# Patient Record
Sex: Female | Born: 1960 | Race: White | Hispanic: No | Marital: Married | Smoking: Never smoker
Health system: Southern US, Community
[De-identification: ages and names within clinical notes are randomized; demographics above are authoritative.]

## PROBLEM LIST (undated history)

## (undated) DIAGNOSIS — M8580 Other specified disorders of bone density and structure, unspecified site: Secondary | ICD-10-CM

## (undated) DIAGNOSIS — A90 Dengue fever [classical dengue]: Secondary | ICD-10-CM

## (undated) DIAGNOSIS — IMO0002 Reserved for concepts with insufficient information to code with codable children: Secondary | ICD-10-CM

## (undated) DIAGNOSIS — M329 Systemic lupus erythematosus, unspecified: Secondary | ICD-10-CM

## (undated) HISTORY — DX: Systemic lupus erythematosus, unspecified: M32.9

## (undated) HISTORY — DX: Other specified disorders of bone density and structure, unspecified site: M85.80

## (undated) HISTORY — DX: Dengue fever (classical dengue): A90

## (undated) HISTORY — PX: HERNIA REPAIR: SHX51

## (undated) HISTORY — DX: Reserved for concepts with insufficient information to code with codable children: IMO0002

---

## 2000-10-17 ENCOUNTER — Other Ambulatory Visit: Admission: RE | Admit: 2000-10-17 | Discharge: 2000-10-17 | Payer: Self-pay | Admitting: Obstetrics and Gynecology

## 2000-10-26 ENCOUNTER — Encounter: Payer: Self-pay | Admitting: Obstetrics and Gynecology

## 2000-10-26 ENCOUNTER — Encounter: Admission: RE | Admit: 2000-10-26 | Discharge: 2000-10-26 | Payer: Self-pay | Admitting: Obstetrics and Gynecology

## 2001-11-25 ENCOUNTER — Other Ambulatory Visit: Admission: RE | Admit: 2001-11-25 | Discharge: 2001-11-25 | Payer: Self-pay | Admitting: Obstetrics & Gynecology

## 2002-12-17 ENCOUNTER — Encounter: Payer: Self-pay | Admitting: Obstetrics & Gynecology

## 2002-12-17 ENCOUNTER — Ambulatory Visit (HOSPITAL_COMMUNITY): Admission: RE | Admit: 2002-12-17 | Discharge: 2002-12-17 | Payer: Self-pay | Admitting: Obstetrics & Gynecology

## 2003-04-10 ENCOUNTER — Encounter: Admission: RE | Admit: 2003-04-10 | Discharge: 2003-04-10 | Payer: Self-pay | Admitting: Obstetrics & Gynecology

## 2004-01-04 ENCOUNTER — Ambulatory Visit (HOSPITAL_COMMUNITY): Admission: RE | Admit: 2004-01-04 | Discharge: 2004-01-04 | Payer: Self-pay | Admitting: Obstetrics & Gynecology

## 2005-03-07 ENCOUNTER — Ambulatory Visit (HOSPITAL_COMMUNITY): Admission: RE | Admit: 2005-03-07 | Discharge: 2005-03-07 | Payer: Self-pay | Admitting: Obstetrics & Gynecology

## 2006-02-12 ENCOUNTER — Ambulatory Visit (HOSPITAL_COMMUNITY): Admission: RE | Admit: 2006-02-12 | Discharge: 2006-02-12 | Payer: Self-pay | Admitting: Obstetrics & Gynecology

## 2006-03-08 ENCOUNTER — Ambulatory Visit (HOSPITAL_COMMUNITY): Admission: RE | Admit: 2006-03-08 | Discharge: 2006-03-08 | Payer: Self-pay | Admitting: Obstetrics & Gynecology

## 2007-03-11 ENCOUNTER — Ambulatory Visit (HOSPITAL_COMMUNITY): Admission: RE | Admit: 2007-03-11 | Discharge: 2007-03-11 | Payer: Self-pay | Admitting: Obstetrics & Gynecology

## 2008-03-17 ENCOUNTER — Ambulatory Visit (HOSPITAL_COMMUNITY): Admission: RE | Admit: 2008-03-17 | Discharge: 2008-03-17 | Payer: Self-pay | Admitting: Obstetrics & Gynecology

## 2009-03-18 ENCOUNTER — Ambulatory Visit (HOSPITAL_COMMUNITY): Admission: RE | Admit: 2009-03-18 | Discharge: 2009-03-18 | Payer: Self-pay | Admitting: Obstetrics & Gynecology

## 2010-03-18 ENCOUNTER — Ambulatory Visit (HOSPITAL_COMMUNITY)
Admission: RE | Admit: 2010-03-18 | Discharge: 2010-03-18 | Payer: Self-pay | Source: Home / Self Care | Attending: Obstetrics & Gynecology | Admitting: Obstetrics & Gynecology

## 2010-10-12 ENCOUNTER — Other Ambulatory Visit: Payer: Self-pay | Admitting: Rheumatology

## 2010-10-12 ENCOUNTER — Ambulatory Visit
Admission: RE | Admit: 2010-10-12 | Discharge: 2010-10-12 | Disposition: A | Discharge status: Home / Self Care | Payer: 59 | Source: Ambulatory Visit | Attending: Rheumatology | Admitting: Rheumatology

## 2010-10-12 DIAGNOSIS — M542 Cervicalgia: Secondary | ICD-10-CM

## 2011-01-02 ENCOUNTER — Other Ambulatory Visit: Payer: Self-pay | Admitting: Obstetrics & Gynecology

## 2011-01-02 DIAGNOSIS — Z1231 Encounter for screening mammogram for malignant neoplasm of breast: Secondary | ICD-10-CM

## 2011-03-20 ENCOUNTER — Ambulatory Visit (HOSPITAL_COMMUNITY)
Admission: RE | Admit: 2011-03-20 | Discharge: 2011-03-20 | Disposition: A | Discharge status: Home / Self Care | Payer: 59 | Source: Ambulatory Visit | Attending: Obstetrics & Gynecology | Admitting: Obstetrics & Gynecology

## 2011-03-20 DIAGNOSIS — Z1231 Encounter for screening mammogram for malignant neoplasm of breast: Secondary | ICD-10-CM | POA: Insufficient documentation

## 2012-01-01 ENCOUNTER — Other Ambulatory Visit: Payer: Self-pay | Admitting: Obstetrics & Gynecology

## 2012-01-01 DIAGNOSIS — Z1231 Encounter for screening mammogram for malignant neoplasm of breast: Secondary | ICD-10-CM

## 2012-03-21 ENCOUNTER — Ambulatory Visit (HOSPITAL_COMMUNITY)
Admission: RE | Admit: 2012-03-21 | Discharge: 2012-03-21 | Disposition: A | Discharge status: Home / Self Care | Payer: 59 | Source: Ambulatory Visit | Attending: Obstetrics & Gynecology | Admitting: Obstetrics & Gynecology

## 2012-03-21 ENCOUNTER — Ambulatory Visit (HOSPITAL_COMMUNITY): Payer: 59

## 2012-03-21 DIAGNOSIS — Z1231 Encounter for screening mammogram for malignant neoplasm of breast: Secondary | ICD-10-CM | POA: Insufficient documentation

## 2012-07-18 DIAGNOSIS — A90 Dengue fever [classical dengue]: Secondary | ICD-10-CM

## 2012-07-18 HISTORY — DX: Dengue fever (classical dengue): A90

## 2013-01-09 ENCOUNTER — Encounter: Payer: Self-pay | Admitting: Obstetrics & Gynecology

## 2013-02-17 ENCOUNTER — Other Ambulatory Visit: Payer: Self-pay | Admitting: Obstetrics & Gynecology

## 2013-02-17 DIAGNOSIS — Z1231 Encounter for screening mammogram for malignant neoplasm of breast: Secondary | ICD-10-CM

## 2013-03-27 ENCOUNTER — Ambulatory Visit (HOSPITAL_COMMUNITY)
Admission: RE | Admit: 2013-03-27 | Discharge: 2013-03-27 | Disposition: A | Discharge status: Home / Self Care | Payer: 59 | Source: Ambulatory Visit | Attending: Obstetrics & Gynecology | Admitting: Obstetrics & Gynecology

## 2013-03-27 ENCOUNTER — Ambulatory Visit (INDEPENDENT_AMBULATORY_CARE_PROVIDER_SITE_OTHER): Payer: 59 | Admitting: Obstetrics & Gynecology

## 2013-03-27 ENCOUNTER — Encounter: Payer: Self-pay | Admitting: Obstetrics & Gynecology

## 2013-03-27 VITALS — BP 142/86 | HR 64 | Temp 97.8°F | Ht 62.0 in | Wt 111.0 lb

## 2013-03-27 DIAGNOSIS — Z1231 Encounter for screening mammogram for malignant neoplasm of breast: Secondary | ICD-10-CM | POA: Insufficient documentation

## 2013-03-27 DIAGNOSIS — Z01419 Encounter for gynecological examination (general) (routine) without abnormal findings: Secondary | ICD-10-CM
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
## 2013-03-28 NOTE — Patient Instructions (Signed)

## 2013-03-31 LAB — PAP IG AND HPV HIGH-RISK: HPV DNA High Risk: NOT DETECTED

## 2013-12-23 ENCOUNTER — Other Ambulatory Visit: Payer: Self-pay | Admitting: Obstetrics & Gynecology

## 2014-01-22 ENCOUNTER — Other Ambulatory Visit: Payer: Self-pay | Admitting: *Deleted

## 2014-01-22 DIAGNOSIS — N951 Menopausal and female climacteric states: Secondary | ICD-10-CM

## 2014-01-22 MED ORDER — CONJ ESTROG-MEDROXYPROGEST ACE 0.625-2.5 MG PO TABS: ORAL_TABLET | ORAL | Status: DC

## 2014-01-23 ENCOUNTER — Other Ambulatory Visit: Payer: Self-pay

## 2014-01-23 DIAGNOSIS — Z1231 Encounter for screening mammogram for malignant neoplasm of breast: Secondary | ICD-10-CM

## 2014-02-25 ENCOUNTER — Other Ambulatory Visit: Payer: Self-pay | Admitting: Obstetrics & Gynecology

## 2014-02-25 DIAGNOSIS — Z1231 Encounter for screening mammogram for malignant neoplasm of breast: Secondary | ICD-10-CM

## 2014-03-16 ENCOUNTER — Encounter: Payer: Self-pay | Admitting: *Deleted

## 2014-03-17 ENCOUNTER — Encounter: Payer: Self-pay | Admitting: Obstetrics & Gynecology

## 2014-03-30 ENCOUNTER — Ambulatory Visit: Payer: 59 | Admitting: Obstetrics & Gynecology

## 2014-04-02 ENCOUNTER — Ambulatory Visit (HOSPITAL_COMMUNITY)
Admission: RE | Admit: 2014-04-02 | Discharge: 2014-04-02 | Disposition: A | Discharge status: Home / Self Care | Payer: 59 | Source: Ambulatory Visit | Attending: Obstetrics & Gynecology | Admitting: Obstetrics & Gynecology

## 2014-04-02 DIAGNOSIS — Z1231 Encounter for screening mammogram for malignant neoplasm of breast: Secondary | ICD-10-CM

## 2014-04-06 ENCOUNTER — Other Ambulatory Visit: Payer: Self-pay | Admitting: Obstetrics & Gynecology

## 2014-04-06 DIAGNOSIS — N6452 Nipple discharge: Secondary | ICD-10-CM

## 2014-04-21 ENCOUNTER — Ambulatory Visit
Admission: RE | Admit: 2014-04-21 | Discharge: 2014-04-21 | Disposition: A | Discharge status: Home / Self Care | Payer: 59 | Source: Ambulatory Visit | Attending: Obstetrics & Gynecology | Admitting: Obstetrics & Gynecology

## 2014-04-21 DIAGNOSIS — N6452 Nipple discharge: Secondary | ICD-10-CM

## 2014-04-27 ENCOUNTER — Telehealth: Payer: Self-pay | Admitting: Obstetrics

## 2014-04-27 NOTE — Telephone Encounter (Signed)
04/27/2014 - Patient returned call and scheduled appt. brm

## 2014-04-30 ENCOUNTER — Ambulatory Visit (INDEPENDENT_AMBULATORY_CARE_PROVIDER_SITE_OTHER): Payer: 59 | Admitting: Certified Nurse Midwife

## 2014-04-30 ENCOUNTER — Encounter: Payer: Self-pay | Admitting: Certified Nurse Midwife

## 2014-04-30 VITALS — BP 130/79 | HR 59 | Temp 97.9°F | Ht 62.0 in | Wt 108.0 lb

## 2014-04-30 DIAGNOSIS — R6889 Other general symptoms and signs: Secondary | ICD-10-CM

## 2014-04-30 DIAGNOSIS — Z01419 Encounter for gynecological examination (general) (routine) without abnormal findings: Secondary | ICD-10-CM

## 2014-04-30 DIAGNOSIS — Z124 Encounter for screening for malignant neoplasm of cervix: Secondary | ICD-10-CM

## 2014-04-30 DIAGNOSIS — Z119 Encounter for screening for infectious and parasitic diseases, unspecified: Secondary | ICD-10-CM

## 2014-04-30 DIAGNOSIS — Z113 Encounter for screening for infections with a predominantly sexual mode of transmission: Secondary | ICD-10-CM

## 2014-04-30 DIAGNOSIS — Z0001 Encounter for general adult medical examination with abnormal findings: Secondary | ICD-10-CM
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
## 2014-05-05 LAB — SURESWAB, VAGINOSIS/VAGINITIS PLUS
Atopobium vaginae: NOT DETECTED Log (cells/mL)
C. ALBICANS, DNA: NOT DETECTED
C. PARAPSILOSIS, DNA: NOT DETECTED
C. TRACHOMATIS RNA, TMA: NOT DETECTED
C. TROPICALIS, DNA: NOT DETECTED
C. glabrata, DNA: NOT DETECTED
GARDNERELLA VAGINALIS: NOT DETECTED Log (cells/mL)
LACTOBACILLUS SPECIES: >8 Log (cells/mL)
MEGASPHAERA SPECIES: NOT DETECTED Log (cells/mL)
N. gonorrhoeae RNA, TMA: NOT DETECTED
T. VAGINALIS RNA, QL TMA: NOT DETECTED

## 2014-05-05 LAB — PAP IG AND HPV HIGH-RISK: HPV DNA HIGH RISK: NOT DETECTED

## 2014-10-12 ENCOUNTER — Other Ambulatory Visit: Payer: Self-pay | Admitting: Obstetrics & Gynecology

## 2014-10-12 DIAGNOSIS — N6452 Nipple discharge: Secondary | ICD-10-CM

## 2014-10-20 ENCOUNTER — Other Ambulatory Visit: Payer: Self-pay | Admitting: Obstetrics & Gynecology

## 2014-10-20 ENCOUNTER — Other Ambulatory Visit: Payer: Self-pay

## 2014-10-20 DIAGNOSIS — N6452 Nipple discharge: Secondary | ICD-10-CM

## 2014-10-21 ENCOUNTER — Other Ambulatory Visit: Payer: Self-pay | Admitting: Obstetrics

## 2014-10-21 ENCOUNTER — Other Ambulatory Visit: Payer: 59

## 2014-10-21 DIAGNOSIS — N6452 Nipple discharge: Secondary | ICD-10-CM

## 2014-10-27 ENCOUNTER — Ambulatory Visit
Admission: RE | Admit: 2014-10-27 | Discharge: 2014-10-27 | Disposition: A | Discharge status: Home / Self Care | Payer: 59 | Source: Ambulatory Visit | Attending: Obstetrics | Admitting: Obstetrics

## 2014-10-27 DIAGNOSIS — N6452 Nipple discharge: Secondary | ICD-10-CM

## 2015-01-28 ENCOUNTER — Telehealth: Payer: Self-pay | Admitting: *Deleted

## 2015-01-28 NOTE — Telephone Encounter (Signed)
Patient reports she was seen for her annual exam last year and was not please with her visit or the way her insurance was billed. She is a former patient of Dr Delsa Sale and she saw [facility]. 11:19 Call to patient- she did come when [place] first started and we discussed that- she said she did not get refills on her HRT and when she needed additional views on her MM she had to be rescheduled because they had not received authorization. ( Do not see that request- and told her that- I apologized for that)  She also states she was seen for a wellness visit and got charges she normally never had on her previous visits. Told her I was not sure how that happened- she should let the provider know she is using wellness coverage at her appointment- so that the appropriate charges can be applied. (will have [facility] [place] check) got number for her [facility] and will call her refills in- she is due her annual exam in February- but not sure if she will be returning. [facility] [phone] opt 3

## 2015-04-20 ENCOUNTER — Other Ambulatory Visit: Payer: Self-pay | Admitting: Obstetrics

## 2015-04-20 ENCOUNTER — Other Ambulatory Visit: Payer: Self-pay | Admitting: Certified Nurse Midwife

## 2015-04-20 DIAGNOSIS — N6452 Nipple discharge: Secondary | ICD-10-CM

## 2015-04-27 ENCOUNTER — Ambulatory Visit
Admission: RE | Admit: 2015-04-27 | Discharge: 2015-04-27 | Disposition: A | Discharge status: Home / Self Care | Payer: 59 | Source: Ambulatory Visit | Attending: Obstetrics | Admitting: Obstetrics

## 2015-04-27 DIAGNOSIS — N6452 Nipple discharge: Secondary | ICD-10-CM

## 2015-12-30 ENCOUNTER — Other Ambulatory Visit: Payer: Self-pay | Admitting: Certified Nurse Midwife

## 2015-12-30 DIAGNOSIS — N951 Menopausal and female climacteric states: Secondary | ICD-10-CM

## 2016-05-23 ENCOUNTER — Other Ambulatory Visit: Payer: Self-pay | Admitting: Family Medicine

## 2016-05-23 DIAGNOSIS — D242 Benign neoplasm of left breast: Secondary | ICD-10-CM

## 2016-06-12 ENCOUNTER — Other Ambulatory Visit: Payer: 59

## 2016-06-13 ENCOUNTER — Ambulatory Visit
Admission: RE | Admit: 2016-06-13 | Discharge: 2016-06-13 | Disposition: A | Discharge status: Home / Self Care | Payer: 59 | Source: Ambulatory Visit | Attending: Family Medicine | Admitting: Family Medicine

## 2016-06-13 ENCOUNTER — Other Ambulatory Visit: Payer: Self-pay | Admitting: Family Medicine

## 2016-06-13 DIAGNOSIS — R928 Other abnormal and inconclusive findings on diagnostic imaging of breast: Secondary | ICD-10-CM

## 2016-06-13 DIAGNOSIS — D242 Benign neoplasm of left breast: Secondary | ICD-10-CM

## 2016-06-13 DIAGNOSIS — N6320 Unspecified lump in the left breast, unspecified quadrant: Secondary | ICD-10-CM

## 2016-06-14 ENCOUNTER — Telehealth: Payer: Self-pay | Admitting: *Deleted

## 2016-06-14 NOTE — Telephone Encounter (Signed)
Patient is calling to let Dr Jodi Mourning know that she has been diagnosed with early stage breast cancer and she has to stop her HRT. She need to know how to do that. Per Dr Jodi Mourning- she will need to stop- she may want to step down her dose as to every other day for a couple week then weekly then stop to try to minimize her symptoms or she can stop- she will have symptoms and she may want to expect them.

## 2018-04-24 DIAGNOSIS — M329 Systemic lupus erythematosus, unspecified: Secondary | ICD-10-CM | POA: Diagnosis not present

## 2018-04-24 DIAGNOSIS — M8580 Other specified disorders of bone density and structure, unspecified site: Secondary | ICD-10-CM | POA: Diagnosis not present

## 2018-08-07 DIAGNOSIS — M8589 Other specified disorders of bone density and structure, multiple sites: Secondary | ICD-10-CM | POA: Diagnosis not present

## 2018-08-09 DIAGNOSIS — Z9889 Other specified postprocedural states: Secondary | ICD-10-CM | POA: Diagnosis not present

## 2018-08-09 DIAGNOSIS — Z9012 Acquired absence of left breast and nipple: Secondary | ICD-10-CM | POA: Diagnosis not present

## 2018-08-09 DIAGNOSIS — Z923 Personal history of irradiation: Secondary | ICD-10-CM | POA: Diagnosis not present

## 2018-08-09 DIAGNOSIS — Z170 Estrogen receptor positive status [ER+]: Secondary | ICD-10-CM | POA: Diagnosis not present

## 2018-08-09 DIAGNOSIS — D0512 Intraductal carcinoma in situ of left breast: Secondary | ICD-10-CM | POA: Diagnosis not present

## 2018-11-17 IMAGING — MG 2D DIGITAL DIAGNOSTIC BILATERAL MAMMOGRAM WITH CAD AND ADJUNCT T
8 of 12 series · 8 of 24 positions shown · non-contrast
Comparison: Previous exam(s).

CLINICAL DATA: Followup probable left breast fibroadenomas.

EXAM:
2D DIGITAL DIAGNOSTIC BILATERAL MAMMOGRAM WITH CAD AND ADJUNCT TOMO
ULTRASOUND LEFT BREAST

[L CC (1 of 2)]
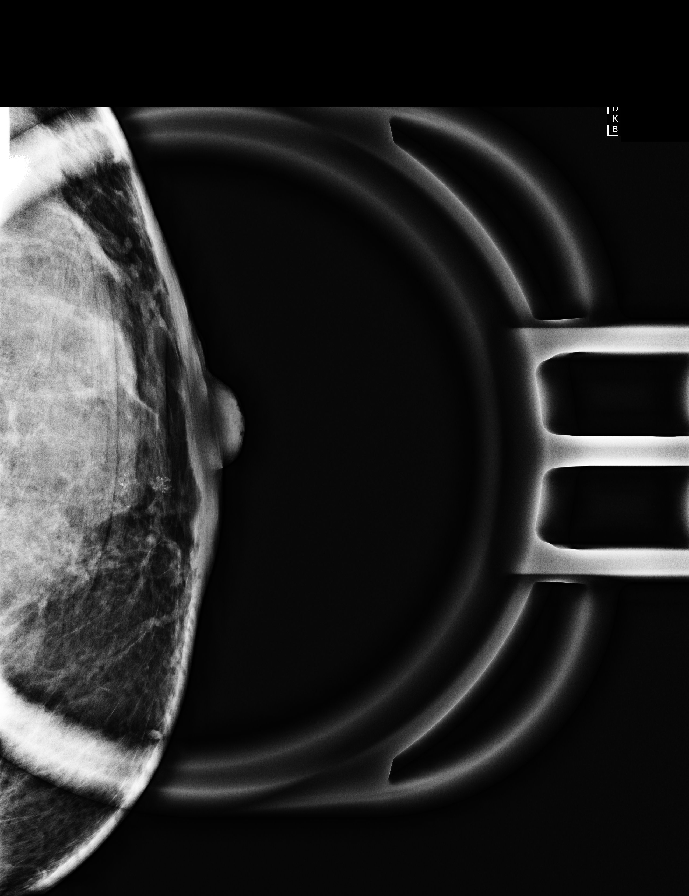

[L ML]
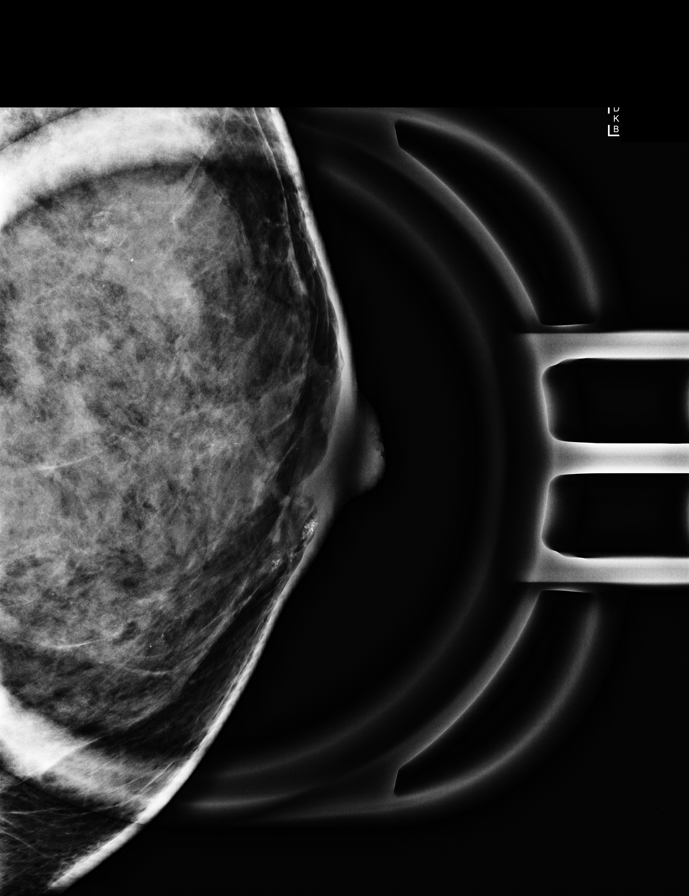

[R CC synth-2D]
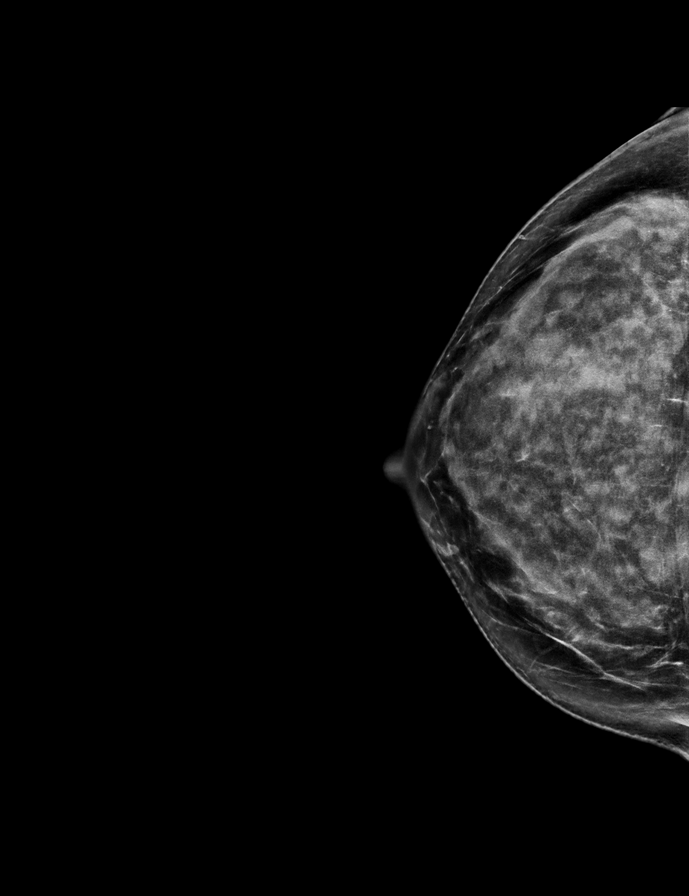

[L CC synth-2D]
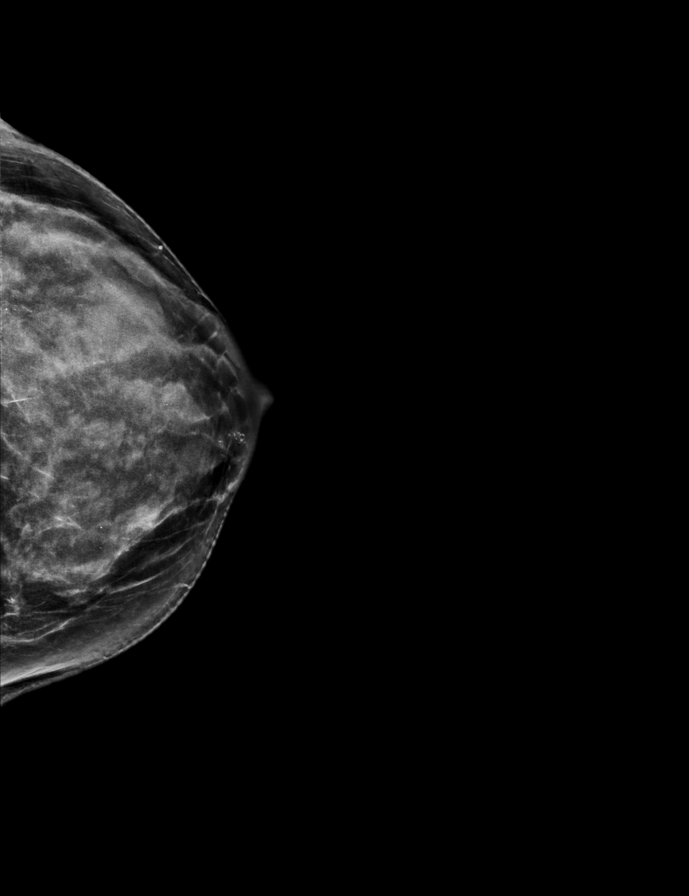

[L MLO synth-2D]
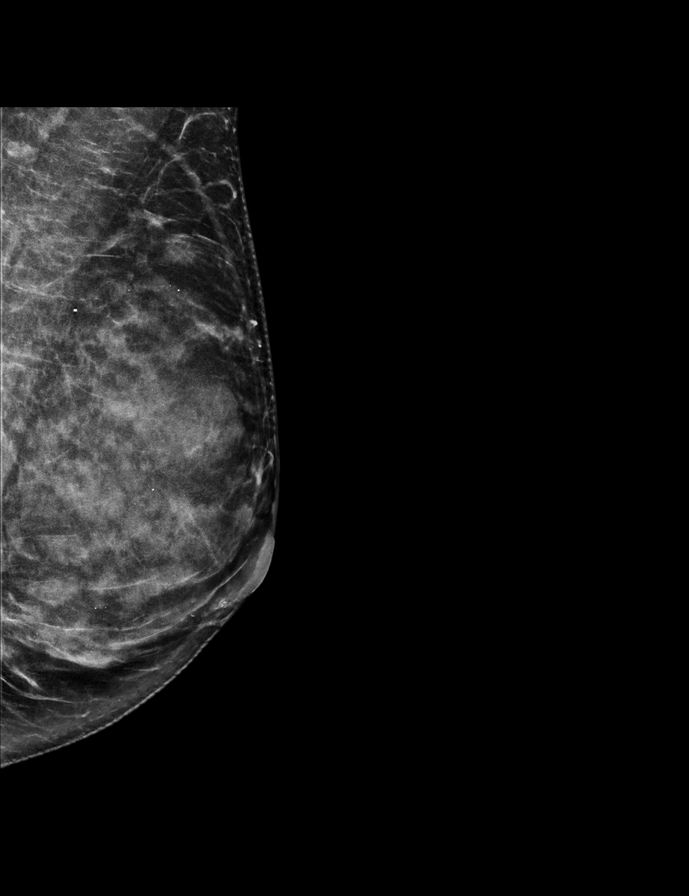

[R MLO synth-2D]
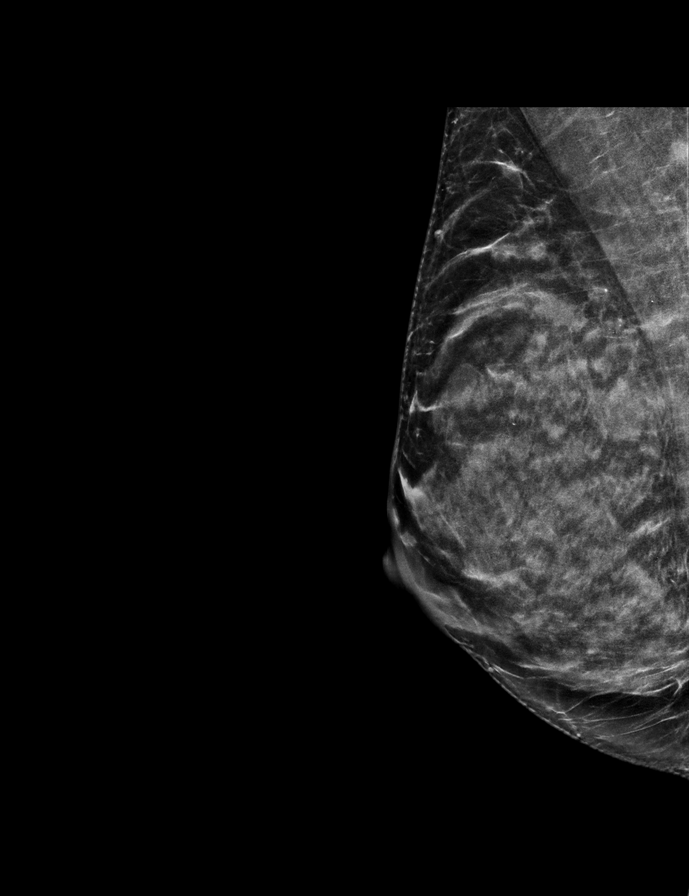

[L CC (2 of 2)]
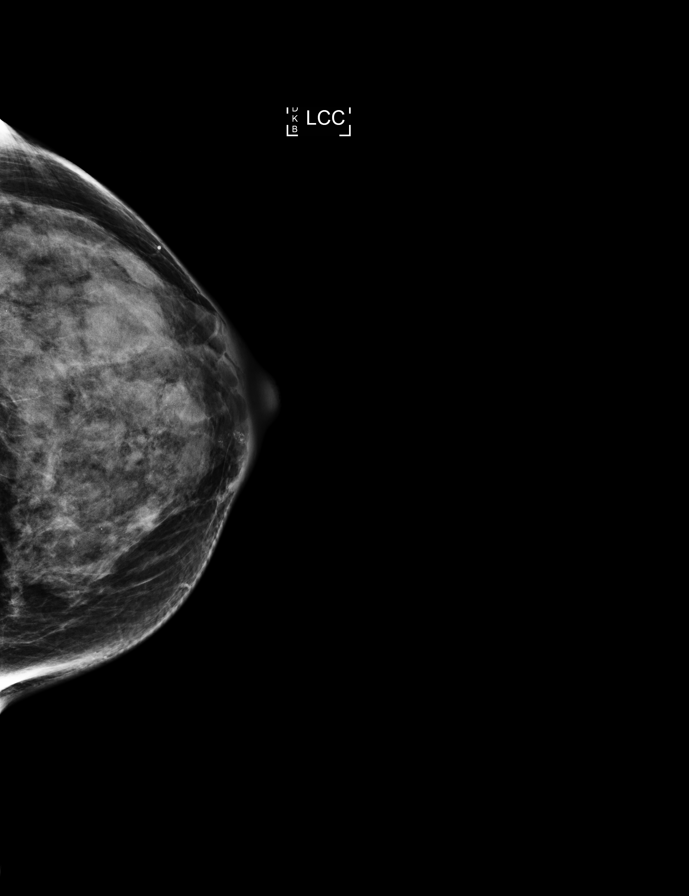

[L MLO]
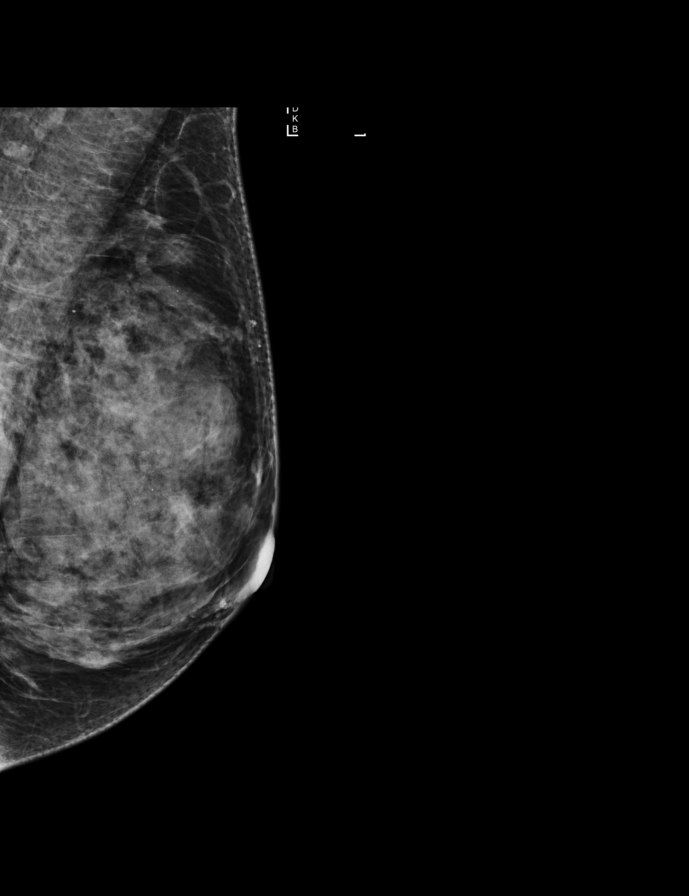

[8 of 24 positions shown; findings below may reference images not displayed]

ACR Breast Density Category d: The breast tissue is extremely dense,
which lowers the sensitivity of mammography.
FINDINGS: There is an 8 x 7 x 4 mm group of a large number of developing
microcalcifications in the 6:30 o'clock retroareolar region of the
left breast, superficially. There has been no significant change in
an oval, circumscribed and partially obscured mass in the lateral
left breast. No findings elsewhere in either breast suspicious for
malignancy.

Mammographic images were processed with CAD.

On physical exam, there is an approximately 2.5 x 1.5 cm oval,
circumscribed, palpable mass in the 1 o'clock position of the left
breast, 3 cm from the nipple. There is a small vertically-oriented
area of linear soft tissue thickening in the 6:30 o'clock
retroareolar left breast.

Targeted ultrasound is performed, showing a 2.2 x 2.1 x 1.0 cm oval,
horizontally oriented, circumscribed, hypoechoic mass in the 1
o'clock position of the left breast, 3 cm from the nipple. This
measured 2.1 x 2.1 x 1.1 cm on 04/27/2015, 2.3 x 1.9 x 1.2 cm on
10/27/2014 and 2.4 x 1.9 x 1.2 cm on 04/21/2014.

In the 12 o'clock position of the left breast, 2 cm from the nipple,
a 0.7 x 0.6 x 0.6 cm rounded, hypoechoic mass is demonstrated. This
measured 0.7 x 0.6 x 0.6 cm on 04/27/2015, 0.8 x 0.8 x 0.5 cm on
10/27/2014 and 0.8 x 0.7 x 0.5 cm on 04/21/2014.

Multiple dilated retroareolar ducts are demonstrated. In the 6:30
o'clock retroareolar region, some of these ducts contain debris and
microcalcifications.
IMPRESSION: 1. 8 x 7 x 4 mm developing group of microcalcifications in the 6:30
o'clock retroareolar left breast. These are within dilated ducts
containing debris.
2. Stable fibroadenomas in the 1 o'clock and 12 o'clock positions of
the left breast. The 2 year stability is compatible with a benign
process and these do not need further follow-up.

RECOMMENDATION:
Ultrasound-guided core needle biopsy of dilated ducts containing
debris and microcalcifications in the 6:30 o'clock retroareolar left
breast, to exclude the possibility of ductal carcinoma in situ. This
has been discussed with the patient and scheduled to follow.

I have discussed the findings and recommendations with the patient.
Results were also provided in writing at the conclusion of the
visit. If applicable, a reminder letter will be sent to the patient
regarding the next appointment.

BI-RADS CATEGORY  4: Suspicious.

## 2019-01-28 DIAGNOSIS — Z0000 Encounter for general adult medical examination without abnormal findings: Secondary | ICD-10-CM | POA: Diagnosis not present

## 2019-01-28 DIAGNOSIS — Z01419 Encounter for gynecological examination (general) (routine) without abnormal findings: Secondary | ICD-10-CM | POA: Diagnosis not present

## 2019-01-28 DIAGNOSIS — Z13220 Encounter for screening for lipoid disorders: Secondary | ICD-10-CM | POA: Diagnosis not present

## 2019-01-28 DIAGNOSIS — Z6821 Body mass index (BMI) 21.0-21.9, adult: Secondary | ICD-10-CM | POA: Diagnosis not present

## 2019-05-12 DIAGNOSIS — M8580 Other specified disorders of bone density and structure, unspecified site: Secondary | ICD-10-CM | POA: Diagnosis not present

## 2019-05-12 DIAGNOSIS — M329 Systemic lupus erythematosus, unspecified: Secondary | ICD-10-CM | POA: Diagnosis not present

## 2019-05-12 DIAGNOSIS — E7800 Pure hypercholesterolemia, unspecified: Secondary | ICD-10-CM | POA: Diagnosis not present

## 2019-05-30 DIAGNOSIS — H353121 Nonexudative age-related macular degeneration, left eye, early dry stage: Secondary | ICD-10-CM | POA: Diagnosis not present

## 2019-08-14 DIAGNOSIS — D0512 Intraductal carcinoma in situ of left breast: Secondary | ICD-10-CM | POA: Diagnosis not present

## 2019-08-20 DIAGNOSIS — N6489 Other specified disorders of breast: Secondary | ICD-10-CM | POA: Diagnosis not present

## 2019-08-20 DIAGNOSIS — N6089 Other benign mammary dysplasias of unspecified breast: Secondary | ICD-10-CM | POA: Diagnosis not present

## 2019-08-20 DIAGNOSIS — R921 Mammographic calcification found on diagnostic imaging of breast: Secondary | ICD-10-CM | POA: Diagnosis not present

## 2019-08-20 DIAGNOSIS — N6012 Diffuse cystic mastopathy of left breast: Secondary | ICD-10-CM | POA: Diagnosis not present

## 2019-08-27 DIAGNOSIS — D0512 Intraductal carcinoma in situ of left breast: Secondary | ICD-10-CM | POA: Diagnosis not present

## 2019-12-01 DIAGNOSIS — H35372 Puckering of macula, left eye: Secondary | ICD-10-CM | POA: Diagnosis not present

## 2020-01-20 DIAGNOSIS — H35373 Puckering of macula, bilateral: Secondary | ICD-10-CM | POA: Diagnosis not present

## 2020-01-20 DIAGNOSIS — H26492 Other secondary cataract, left eye: Secondary | ICD-10-CM | POA: Diagnosis not present

## 2020-01-20 DIAGNOSIS — H26493 Other secondary cataract, bilateral: Secondary | ICD-10-CM | POA: Diagnosis not present

## 2020-01-20 DIAGNOSIS — Z961 Presence of intraocular lens: Secondary | ICD-10-CM | POA: Diagnosis not present

## 2020-01-20 DIAGNOSIS — H18413 Arcus senilis, bilateral: Secondary | ICD-10-CM | POA: Diagnosis not present

## 2020-02-02 DIAGNOSIS — H3581 Retinal edema: Secondary | ICD-10-CM | POA: Diagnosis not present

## 2020-02-02 DIAGNOSIS — H43813 Vitreous degeneration, bilateral: Secondary | ICD-10-CM | POA: Diagnosis not present

## 2020-02-02 DIAGNOSIS — H35373 Puckering of macula, bilateral: Secondary | ICD-10-CM | POA: Diagnosis not present

## 2020-02-02 DIAGNOSIS — H43392 Other vitreous opacities, left eye: Secondary | ICD-10-CM | POA: Diagnosis not present

## 2020-02-06 DIAGNOSIS — Z13220 Encounter for screening for lipoid disorders: Secondary | ICD-10-CM | POA: Diagnosis not present

## 2020-02-06 DIAGNOSIS — Z131 Encounter for screening for diabetes mellitus: Secondary | ICD-10-CM | POA: Diagnosis not present

## 2020-02-06 DIAGNOSIS — Z0000 Encounter for general adult medical examination without abnormal findings: Secondary | ICD-10-CM | POA: Diagnosis not present

## 2020-02-10 DIAGNOSIS — H26491 Other secondary cataract, right eye: Secondary | ICD-10-CM | POA: Diagnosis not present

## 2020-02-16 DIAGNOSIS — M8589 Other specified disorders of bone density and structure, multiple sites: Secondary | ICD-10-CM | POA: Diagnosis not present

## 2020-02-17 DIAGNOSIS — Z6821 Body mass index (BMI) 21.0-21.9, adult: Secondary | ICD-10-CM | POA: Diagnosis not present

## 2020-02-17 DIAGNOSIS — Z01419 Encounter for gynecological examination (general) (routine) without abnormal findings: Secondary | ICD-10-CM | POA: Diagnosis not present

## 2020-02-17 DIAGNOSIS — Z124 Encounter for screening for malignant neoplasm of cervix: Secondary | ICD-10-CM | POA: Diagnosis not present

## 2020-02-23 DIAGNOSIS — L84 Corns and callosities: Secondary | ICD-10-CM | POA: Diagnosis not present

## 2020-02-23 DIAGNOSIS — M79671 Pain in right foot: Secondary | ICD-10-CM | POA: Diagnosis not present

## 2020-02-23 DIAGNOSIS — M2012 Hallux valgus (acquired), left foot: Secondary | ICD-10-CM | POA: Diagnosis not present

## 2020-02-23 DIAGNOSIS — M2011 Hallux valgus (acquired), right foot: Secondary | ICD-10-CM | POA: Diagnosis not present

## 2020-02-26 DIAGNOSIS — H3581 Retinal edema: Secondary | ICD-10-CM | POA: Diagnosis not present

## 2020-02-26 DIAGNOSIS — H35372 Puckering of macula, left eye: Secondary | ICD-10-CM | POA: Diagnosis not present

## 2020-02-27 DIAGNOSIS — H35373 Puckering of macula, bilateral: Secondary | ICD-10-CM | POA: Diagnosis not present

## 2020-02-27 DIAGNOSIS — H3581 Retinal edema: Secondary | ICD-10-CM | POA: Diagnosis not present

## 2020-03-05 DIAGNOSIS — H35372 Puckering of macula, left eye: Secondary | ICD-10-CM | POA: Diagnosis not present

## 2020-03-05 DIAGNOSIS — H3581 Retinal edema: Secondary | ICD-10-CM | POA: Diagnosis not present

## 2020-03-09 DIAGNOSIS — M8580 Other specified disorders of bone density and structure, unspecified site: Secondary | ICD-10-CM | POA: Diagnosis not present

## 2020-03-09 DIAGNOSIS — M329 Systemic lupus erythematosus, unspecified: Secondary | ICD-10-CM | POA: Diagnosis not present

## 2020-03-09 DIAGNOSIS — E7800 Pure hypercholesterolemia, unspecified: Secondary | ICD-10-CM | POA: Diagnosis not present

## 2020-03-09 DIAGNOSIS — M3214 Glomerular disease in systemic lupus erythematosus: Secondary | ICD-10-CM | POA: Diagnosis not present

## 2020-04-07 DIAGNOSIS — H3581 Retinal edema: Secondary | ICD-10-CM | POA: Diagnosis not present

## 2020-04-07 DIAGNOSIS — H35371 Puckering of macula, right eye: Secondary | ICD-10-CM | POA: Diagnosis not present

## 2020-08-30 DIAGNOSIS — D0512 Intraductal carcinoma in situ of left breast: Secondary | ICD-10-CM | POA: Diagnosis not present

## 2020-08-30 DIAGNOSIS — Z9889 Other specified postprocedural states: Secondary | ICD-10-CM | POA: Diagnosis not present

## 2020-10-15 DIAGNOSIS — H43391 Other vitreous opacities, right eye: Secondary | ICD-10-CM | POA: Diagnosis not present

## 2020-10-15 DIAGNOSIS — H35371 Puckering of macula, right eye: Secondary | ICD-10-CM | POA: Diagnosis not present

## 2020-11-09 DIAGNOSIS — D2271 Melanocytic nevi of right lower limb, including hip: Secondary | ICD-10-CM | POA: Diagnosis not present

## 2020-11-09 DIAGNOSIS — Z85828 Personal history of other malignant neoplasm of skin: Secondary | ICD-10-CM | POA: Diagnosis not present

## 2020-11-09 DIAGNOSIS — D225 Melanocytic nevi of trunk: Secondary | ICD-10-CM | POA: Diagnosis not present

## 2020-11-09 DIAGNOSIS — L814 Other melanin hyperpigmentation: Secondary | ICD-10-CM | POA: Diagnosis not present

## 2021-01-03 DIAGNOSIS — M9901 Segmental and somatic dysfunction of cervical region: Secondary | ICD-10-CM | POA: Diagnosis not present

## 2021-01-03 DIAGNOSIS — M9902 Segmental and somatic dysfunction of thoracic region: Secondary | ICD-10-CM | POA: Diagnosis not present

## 2021-01-03 DIAGNOSIS — M5032 Other cervical disc degeneration, mid-cervical region, unspecified level: Secondary | ICD-10-CM | POA: Diagnosis not present

## 2021-01-03 DIAGNOSIS — M531 Cervicobrachial syndrome: Secondary | ICD-10-CM | POA: Diagnosis not present

## 2021-01-05 DIAGNOSIS — M5032 Other cervical disc degeneration, mid-cervical region, unspecified level: Secondary | ICD-10-CM | POA: Diagnosis not present

## 2021-01-05 DIAGNOSIS — M531 Cervicobrachial syndrome: Secondary | ICD-10-CM | POA: Diagnosis not present

## 2021-01-05 DIAGNOSIS — M9901 Segmental and somatic dysfunction of cervical region: Secondary | ICD-10-CM | POA: Diagnosis not present

## 2021-01-05 DIAGNOSIS — M9902 Segmental and somatic dysfunction of thoracic region: Secondary | ICD-10-CM | POA: Diagnosis not present

## 2021-02-14 DIAGNOSIS — M9901 Segmental and somatic dysfunction of cervical region: Secondary | ICD-10-CM | POA: Diagnosis not present

## 2021-02-14 DIAGNOSIS — M5032 Other cervical disc degeneration, mid-cervical region, unspecified level: Secondary | ICD-10-CM | POA: Diagnosis not present

## 2021-02-14 DIAGNOSIS — M9902 Segmental and somatic dysfunction of thoracic region: Secondary | ICD-10-CM | POA: Diagnosis not present

## 2021-02-14 DIAGNOSIS — M531 Cervicobrachial syndrome: Secondary | ICD-10-CM | POA: Diagnosis not present

## 2021-02-22 DIAGNOSIS — Z6821 Body mass index (BMI) 21.0-21.9, adult: Secondary | ICD-10-CM | POA: Diagnosis not present

## 2021-02-22 DIAGNOSIS — Z01419 Encounter for gynecological examination (general) (routine) without abnormal findings: Secondary | ICD-10-CM | POA: Diagnosis not present

## 2021-03-29 DIAGNOSIS — M8580 Other specified disorders of bone density and structure, unspecified site: Secondary | ICD-10-CM | POA: Diagnosis not present

## 2021-03-29 DIAGNOSIS — E7800 Pure hypercholesterolemia, unspecified: Secondary | ICD-10-CM | POA: Diagnosis not present

## 2021-03-29 DIAGNOSIS — M329 Systemic lupus erythematosus, unspecified: Secondary | ICD-10-CM | POA: Diagnosis not present

## 2021-03-29 DIAGNOSIS — M3214 Glomerular disease in systemic lupus erythematosus: Secondary | ICD-10-CM | POA: Diagnosis not present

## 2021-04-01 DIAGNOSIS — M329 Systemic lupus erythematosus, unspecified: Secondary | ICD-10-CM | POA: Diagnosis not present

## 2021-08-12 DIAGNOSIS — S40011A Contusion of right shoulder, initial encounter: Secondary | ICD-10-CM | POA: Diagnosis not present

## 2021-08-17 DIAGNOSIS — S42034A Nondisplaced fracture of lateral end of right clavicle, initial encounter for closed fracture: Secondary | ICD-10-CM | POA: Diagnosis not present

## 2021-09-05 DIAGNOSIS — Z923 Personal history of irradiation: Secondary | ICD-10-CM | POA: Diagnosis not present

## 2021-09-05 DIAGNOSIS — Z9012 Acquired absence of left breast and nipple: Secondary | ICD-10-CM | POA: Diagnosis not present

## 2021-09-05 DIAGNOSIS — Z08 Encounter for follow-up examination after completed treatment for malignant neoplasm: Secondary | ICD-10-CM | POA: Diagnosis not present

## 2021-09-05 DIAGNOSIS — D0512 Intraductal carcinoma in situ of left breast: Secondary | ICD-10-CM | POA: Diagnosis not present

## 2021-09-05 DIAGNOSIS — Z853 Personal history of malignant neoplasm of breast: Secondary | ICD-10-CM | POA: Diagnosis not present

## 2021-09-05 DIAGNOSIS — R921 Mammographic calcification found on diagnostic imaging of breast: Secondary | ICD-10-CM | POA: Diagnosis not present

## 2021-09-05 DIAGNOSIS — Z9889 Other specified postprocedural states: Secondary | ICD-10-CM | POA: Diagnosis not present

## 2021-09-07 DIAGNOSIS — M25511 Pain in right shoulder: Secondary | ICD-10-CM | POA: Diagnosis not present

## 2021-09-07 DIAGNOSIS — S42034A Nondisplaced fracture of lateral end of right clavicle, initial encounter for closed fracture: Secondary | ICD-10-CM | POA: Diagnosis not present

## 2021-10-04 DIAGNOSIS — M329 Systemic lupus erythematosus, unspecified: Secondary | ICD-10-CM | POA: Diagnosis not present

## 2021-10-05 DIAGNOSIS — S42034A Nondisplaced fracture of lateral end of right clavicle, initial encounter for closed fracture: Secondary | ICD-10-CM | POA: Diagnosis not present

## 2022-02-16 DIAGNOSIS — M8589 Other specified disorders of bone density and structure, multiple sites: Secondary | ICD-10-CM | POA: Diagnosis not present

## 2022-03-28 DIAGNOSIS — Z01419 Encounter for gynecological examination (general) (routine) without abnormal findings: Secondary | ICD-10-CM | POA: Diagnosis not present

## 2022-03-28 DIAGNOSIS — Z6821 Body mass index (BMI) 21.0-21.9, adult: Secondary | ICD-10-CM | POA: Diagnosis not present

## 2022-03-29 DIAGNOSIS — E7800 Pure hypercholesterolemia, unspecified: Secondary | ICD-10-CM | POA: Diagnosis not present

## 2022-03-29 DIAGNOSIS — M8580 Other specified disorders of bone density and structure, unspecified site: Secondary | ICD-10-CM | POA: Diagnosis not present

## 2022-03-29 DIAGNOSIS — M329 Systemic lupus erythematosus, unspecified: Secondary | ICD-10-CM | POA: Diagnosis not present

## 2022-03-29 DIAGNOSIS — M3214 Glomerular disease in systemic lupus erythematosus: Secondary | ICD-10-CM | POA: Diagnosis not present

## 2022-09-07 DIAGNOSIS — D0512 Intraductal carcinoma in situ of left breast: Secondary | ICD-10-CM | POA: Diagnosis not present

## 2022-10-30 DIAGNOSIS — D122 Benign neoplasm of ascending colon: Secondary | ICD-10-CM | POA: Diagnosis not present

## 2022-10-30 DIAGNOSIS — K5730 Diverticulosis of large intestine without perforation or abscess without bleeding: Secondary | ICD-10-CM | POA: Diagnosis not present

## 2022-10-30 DIAGNOSIS — Z1211 Encounter for screening for malignant neoplasm of colon: Secondary | ICD-10-CM | POA: Diagnosis not present

## 2022-11-24 DIAGNOSIS — D225 Melanocytic nevi of trunk: Secondary | ICD-10-CM | POA: Diagnosis not present

## 2022-11-24 DIAGNOSIS — L814 Other melanin hyperpigmentation: Secondary | ICD-10-CM | POA: Diagnosis not present

## 2022-11-24 DIAGNOSIS — D485 Neoplasm of uncertain behavior of skin: Secondary | ICD-10-CM | POA: Diagnosis not present

## 2022-11-24 DIAGNOSIS — Z85828 Personal history of other malignant neoplasm of skin: Secondary | ICD-10-CM | POA: Diagnosis not present

## 2022-11-24 DIAGNOSIS — L821 Other seborrheic keratosis: Secondary | ICD-10-CM | POA: Diagnosis not present

## 2023-01-19 DIAGNOSIS — Z13220 Encounter for screening for lipoid disorders: Secondary | ICD-10-CM | POA: Diagnosis not present

## 2023-01-19 DIAGNOSIS — Z0000 Encounter for general adult medical examination without abnormal findings: Secondary | ICD-10-CM | POA: Diagnosis not present

## 2023-01-19 DIAGNOSIS — Z23 Encounter for immunization: Secondary | ICD-10-CM | POA: Diagnosis not present

## 2023-02-13 DIAGNOSIS — I10 Essential (primary) hypertension: Secondary | ICD-10-CM | POA: Diagnosis not present

## 2023-03-12 DIAGNOSIS — M810 Age-related osteoporosis without current pathological fracture: Secondary | ICD-10-CM | POA: Diagnosis not present

## 2023-03-12 DIAGNOSIS — M3219 Other organ or system involvement in systemic lupus erythematosus: Secondary | ICD-10-CM | POA: Diagnosis not present

## 2023-04-10 DIAGNOSIS — Z6820 Body mass index (BMI) 20.0-20.9, adult: Secondary | ICD-10-CM | POA: Diagnosis not present

## 2023-04-10 DIAGNOSIS — Z01419 Encounter for gynecological examination (general) (routine) without abnormal findings: Secondary | ICD-10-CM | POA: Diagnosis not present

## 2023-04-10 DIAGNOSIS — Z124 Encounter for screening for malignant neoplasm of cervix: Secondary | ICD-10-CM | POA: Diagnosis not present

## 2023-07-13 DIAGNOSIS — Z923 Personal history of irradiation: Secondary | ICD-10-CM | POA: Diagnosis not present

## 2023-07-13 DIAGNOSIS — Z86000 Personal history of in-situ neoplasm of breast: Secondary | ICD-10-CM | POA: Diagnosis not present

## 2023-07-13 DIAGNOSIS — Z08 Encounter for follow-up examination after completed treatment for malignant neoplasm: Secondary | ICD-10-CM | POA: Diagnosis not present

## 2023-07-13 DIAGNOSIS — D0512 Intraductal carcinoma in situ of left breast: Secondary | ICD-10-CM | POA: Diagnosis not present

## 2023-07-13 DIAGNOSIS — Z9889 Other specified postprocedural states: Secondary | ICD-10-CM | POA: Diagnosis not present

## 2023-07-13 DIAGNOSIS — Z1239 Encounter for other screening for malignant neoplasm of breast: Secondary | ICD-10-CM | POA: Diagnosis not present

## 2023-10-26 ENCOUNTER — Other Ambulatory Visit (HOSPITAL_BASED_OUTPATIENT_CLINIC_OR_DEPARTMENT_OTHER): Payer: Self-pay | Admitting: Rheumatology

## 2023-10-26 DIAGNOSIS — M810 Age-related osteoporosis without current pathological fracture: Secondary | ICD-10-CM

## 2023-12-24 DIAGNOSIS — I10 Essential (primary) hypertension: Secondary | ICD-10-CM | POA: Diagnosis not present

## 2023-12-24 DIAGNOSIS — R030 Elevated blood-pressure reading, without diagnosis of hypertension: Secondary | ICD-10-CM | POA: Diagnosis not present

## 2024-01-28 DIAGNOSIS — I10 Essential (primary) hypertension: Secondary | ICD-10-CM | POA: Diagnosis not present

## 2024-02-06 DIAGNOSIS — R200 Anesthesia of skin: Secondary | ICD-10-CM | POA: Diagnosis not present

## 2024-02-29 ENCOUNTER — Ambulatory Visit (HOSPITAL_BASED_OUTPATIENT_CLINIC_OR_DEPARTMENT_OTHER)
Admission: RE | Admit: 2024-02-29 | Discharge: 2024-02-29 | Disposition: A | Source: Ambulatory Visit | Attending: Rheumatology | Admitting: Rheumatology

## 2024-02-29 DIAGNOSIS — M8589 Other specified disorders of bone density and structure, multiple sites: Secondary | ICD-10-CM | POA: Diagnosis not present

## 2024-02-29 DIAGNOSIS — M810 Age-related osteoporosis without current pathological fracture: Secondary | ICD-10-CM | POA: Diagnosis not present

## 2024-02-29 DIAGNOSIS — Z780 Asymptomatic menopausal state: Secondary | ICD-10-CM | POA: Diagnosis not present

## 2024-06-24 ENCOUNTER — Other Ambulatory Visit (HOSPITAL_BASED_OUTPATIENT_CLINIC_OR_DEPARTMENT_OTHER)
